# Patient Record
Sex: Female | Born: 1975 | Race: Asian | Hispanic: Yes | Smoking: Never smoker
Health system: Southern US, Community
[De-identification: ages and names within clinical notes are randomized; demographics above are authoritative.]

## PROBLEM LIST (undated history)

## (undated) DIAGNOSIS — F329 Major depressive disorder, single episode, unspecified: Secondary | ICD-10-CM

## (undated) DIAGNOSIS — F32A Depression, unspecified: Secondary | ICD-10-CM

## (undated) DIAGNOSIS — F419 Anxiety disorder, unspecified: Secondary | ICD-10-CM

## (undated) DIAGNOSIS — E282 Polycystic ovarian syndrome: Secondary | ICD-10-CM

## (undated) DIAGNOSIS — N939 Abnormal uterine and vaginal bleeding, unspecified: Secondary | ICD-10-CM

## (undated) HISTORY — DX: Polycystic ovarian syndrome: E28.2

## (undated) HISTORY — DX: Abnormal uterine and vaginal bleeding, unspecified: N93.9

## (undated) HISTORY — DX: Anxiety disorder, unspecified: F41.9

## (undated) HISTORY — DX: Depression, unspecified: F32.A

## (undated) HISTORY — DX: Major depressive disorder, single episode, unspecified: F32.9

---

## 2015-03-29 ENCOUNTER — Emergency Department (HOSPITAL_COMMUNITY)
Admission: EM | Admit: 2015-03-29 | Discharge: 2015-03-29 | Disposition: A | Discharge status: Home / Self Care | Payer: BLUE CROSS/BLUE SHIELD | Source: Home / Self Care | Attending: Emergency Medicine | Admitting: Emergency Medicine

## 2015-03-29 ENCOUNTER — Encounter (HOSPITAL_COMMUNITY): Payer: Self-pay | Admitting: Emergency Medicine

## 2015-03-29 ENCOUNTER — Other Ambulatory Visit (HOSPITAL_COMMUNITY)
Admission: RE | Admit: 2015-03-29 | Discharge: 2015-03-29 | Disposition: A | Discharge status: Home / Self Care | Payer: BLUE CROSS/BLUE SHIELD | Source: Ambulatory Visit | Attending: Emergency Medicine | Admitting: Emergency Medicine

## 2015-03-29 DIAGNOSIS — N939 Abnormal uterine and vaginal bleeding, unspecified: Secondary | ICD-10-CM

## 2015-03-29 DIAGNOSIS — D179 Benign lipomatous neoplasm, unspecified: Secondary | ICD-10-CM

## 2015-03-29 DIAGNOSIS — Z113 Encounter for screening for infections with a predominantly sexual mode of transmission: Secondary | ICD-10-CM | POA: Insufficient documentation

## 2015-03-29 DIAGNOSIS — N760 Acute vaginitis: Secondary | ICD-10-CM | POA: Insufficient documentation

## 2015-03-29 LAB — POCT I-STAT, CHEM 8
BUN: 6 mg/dL (ref 6–20)
CHLORIDE: 105 mmol/L (ref 101–111)
Calcium, Ion: 1.24 mmol/L — ABNORMAL HIGH (ref 1.12–1.23)
Creatinine, Ser: 0.8 mg/dL (ref 0.44–1.00)
Glucose, Bld: 103 mg/dL — ABNORMAL HIGH (ref 65–99)
HEMATOCRIT: 30 % — AB (ref 36.0–46.0)
Hemoglobin: 10.2 g/dL — ABNORMAL LOW (ref 12.0–15.0)
POTASSIUM: 3.9 mmol/L (ref 3.5–5.1)
Sodium: 141 mmol/L (ref 135–145)
TCO2: 23 mmol/L (ref 0–100)

## 2015-03-29 LAB — POCT URINALYSIS DIP (DEVICE)
Bilirubin Urine: NEGATIVE
Glucose, UA: NEGATIVE mg/dL
Ketones, ur: NEGATIVE mg/dL
LEUKOCYTES UA: NEGATIVE
NITRITE: NEGATIVE
PH: 7.5 (ref 5.0–8.0)
PROTEIN: NEGATIVE mg/dL
SPECIFIC GRAVITY, URINE: 1.015 (ref 1.005–1.030)
UROBILINOGEN UA: 0.2 mg/dL (ref 0.0–1.0)

## 2015-03-29 LAB — POCT PREGNANCY, URINE: Preg Test, Ur: NEGATIVE

## 2015-03-29 MED ORDER — FERROUS SULFATE 325 (65 FE) MG PO TABS: 325.0000 mg | ORAL_TABLET | Freq: Every day | ORAL | Status: DC

## 2015-03-29 MED ORDER — MEDROXYPROGESTERONE ACETATE 10 MG PO TABS: 10.0000 mg | ORAL_TABLET | Freq: Every day | ORAL | Status: DC

## 2015-03-29 NOTE — Discharge Instructions (Signed)
Please take Provera daily for the next 2 weeks. This should stop your bleeding. You should expect to have a more normal period after you've finished the Provera. Please take an iron pill daily as you are a little anemic.  If the bleeding does not stop or gets worse, please go to [facility]. If you are getting more and more dizzy or the palpitations are getting worse, please go to [facility].  Please call the women's outpatient clinics to set up an appointment for additional evaluation and management. If you are unable to get an appointment there, they also have satellite offices in [place] and [place].

## 2015-03-29 NOTE — ED Notes (Signed)
C/o menstrual problems States for 10 days she has had a heavy cycle States she had these same sx a year ago

## 2015-03-29 NOTE — ED Provider Notes (Signed)
CSN: [id number]     Arrival date & time 03/29/15  1306 History   First MD Initiated Contact with Patient 03/29/15 1332     Chief Complaint  Patient presents with  . Menstrual Problem   (Consider location/radiation/quality/duration/timing/severity/associated sxs/prior Treatment) HPI  She is a 39 year old woman here for evaluation of abnormal bleeding. She states she has a history of irregular periods. She states her period started 10 days ago and has been much heavier than usual. She also reports clots. She states she has to change her pad every hour.  She denies any pain. She is not on any form of birth control. She is not currently sexually active. She denies any vaginal discharge, but does report an acidic odor last week. She states she will get dizzy if she walks too far. She also reports some intermittent shortness of breath and palpitations.  She also mentions a bump on her left lower back that has been present for 2-3 years.  History reviewed. No pertinent past medical history. No past surgical history on file. History reviewed. No pertinent family history. Social History  Substance Use Topics  . Smoking status: None  . Smokeless tobacco: None  . Alcohol Use: None   OB History    No data available     Review of Systems As in history of present illness Allergies  Review of patient's allergies indicates no known allergies.  Home Medications   Prior to Admission medications   Medication Sig Start Date End Date Taking? Authorizing Provider  ferrous sulfate 325 (65 FE) MG tablet Take 1 tablet (325 mg total) by mouth daily with breakfast. 03/29/15   Melony Overly, MD  medroxyPROGESTERone (PROVERA) 10 MG tablet Take 1 tablet (10 mg total) by mouth daily. 03/29/15   Melony Overly, MD   Meds Ordered and Administered this Visit  Medications - No data to display  BP 126/82 mmHg  Pulse 85  Temp(Src) 98.3 F (36.8 C)  Resp 12  SpO2 100%  LMP 03/20/2015 No data  found.   Physical Exam  Constitutional: She is oriented to person, place, and time. She appears well-developed and well-nourished. No distress.  Cardiovascular: Normal rate.   Pulmonary/Chest: Effort normal.  Genitourinary: There is no rash on the right labia. There is no rash on the left labia. Uterus is not enlarged and not tender. Cervix exhibits no motion tenderness and no discharge. Right adnexum displays no mass and no tenderness. Left adnexum displays no mass and no tenderness. There is bleeding in the vagina. No tenderness in the vagina. No signs of injury around the vagina. No vaginal discharge found.  Neurological: She is alert and oriented to person, place, and time.  Skin: Skin is warm and dry. No pallor.  She has a 1 cm soft, mobile, nontender nodule in her left lower back consistent with a lipoma.    ED Course  Procedures (including critical care time)  Labs Review Labs Reviewed  POCT URINALYSIS DIP (DEVICE) - Abnormal; Notable for the following:    Hgb urine dipstick MODERATE (*)    All other components within normal limits  POCT I-STAT, CHEM 8 - Abnormal; Notable for the following:    Glucose, Bld 103 (*)    Calcium, Ion 1.24 (*)    Hemoglobin 10.2 (*)    HCT 30.0 (*)    All other components within normal limits  POCT PREGNANCY, URINE    Imaging Review No results found.     MDM  1. Abnormal uterine bleeding   2. Lipoma    Provera for bleeding. Prescription given for iron pills for anemia. Return precautions reviewed. Reassurance provided regarding lipoma. Recommended follow-up with an OB/GYN doctor for her irregular periods.    Melony Overly, MD 03/29/15 1500

## 2015-03-30 LAB — CERVICOVAGINAL ANCILLARY ONLY
CHLAMYDIA, DNA PROBE: NEGATIVE
Neisseria Gonorrhea: NEGATIVE
Trichomonas: NEGATIVE

## 2015-03-30 NOTE — ED Notes (Signed)
Final report of STD testing negative

## 2015-03-31 LAB — CERVICOVAGINAL ANCILLARY ONLY: Wet Prep (BD Affirm): NEGATIVE

## 2015-04-07 NOTE — ED Notes (Signed)
Final report of wet prep testing negative

## 2015-04-13 ENCOUNTER — Encounter (HOSPITAL_COMMUNITY): Payer: Self-pay | Admitting: Emergency Medicine

## 2015-04-13 ENCOUNTER — Emergency Department (INDEPENDENT_AMBULATORY_CARE_PROVIDER_SITE_OTHER)
Admission: EM | Admit: 2015-04-13 | Discharge: 2015-04-13 | Disposition: A | Discharge status: Home / Self Care | Payer: BLUE CROSS/BLUE SHIELD | Source: Home / Self Care | Attending: Family Medicine | Admitting: Family Medicine

## 2015-04-13 DIAGNOSIS — N939 Abnormal uterine and vaginal bleeding, unspecified: Secondary | ICD-10-CM

## 2015-04-13 MED ORDER — NORETHINDRONE ACET-ETHINYL EST 1-20 MG-MCG PO TABS: ORAL_TABLET | ORAL | Status: AC

## 2015-04-13 NOTE — ED Provider Notes (Signed)
CSN: [id number]     Arrival date & time 04/13/15  1824 History   First MD Initiated Contact with Patient 04/13/15 1942     Chief Complaint  Patient presents with  . Vaginal Bleeding   (Consider location/radiation/quality/duration/timing/severity/associated sxs/prior Treatment) HPI Comments: 39 year old female with persistent vaginal bleeding. This started approximately 3-4 weeks ago. She was seen at this urgent care on November 16 and treated with Provera. The bleeding slowed down and even stopped for short time before restarting approximately 5-6 days ago. She states she has a slow but steady vaginal bleeding. Denies any associated discharge. She does have pelvic cramping. Her recent pregnancy test here was negative. Vaginal exam performed 2 weeks ago was unremarkable other than bleeding.   History reviewed. No pertinent past medical history. No past surgical history on file. No family history on file. Social History  Substance Use Topics  . Smoking status: None  . Smokeless tobacco: None  . Alcohol Use: None   OB History    No data available     Review of Systems  Constitutional: Negative.   HENT: Negative.   Respiratory: Negative.   Cardiovascular: Negative.   Genitourinary: Positive for vaginal bleeding, menstrual problem and pelvic pain. Negative for vaginal discharge.  Neurological: Negative.     Allergies  Review of patient's allergies indicates no known allergies.  Home Medications   Prior to Admission medications   Medication Sig Start Date End Date Taking? Authorizing Provider  ferrous sulfate 325 (65 FE) MG tablet Take 1 tablet (325 mg total) by mouth daily with breakfast. 03/29/15   Melony Overly, MD  medroxyPROGESTERone (PROVERA) 10 MG tablet Take 1 tablet (10 mg total) by mouth daily. 03/29/15   Melony Overly, MD  norethindrone-ethinyl estradiol (MICROGESTIN,JUNEL,LOESTRIN) 1-20 MG-MCG tablet Take 1 tab tid for 3 days, then 1 bid for 3 days, then 1 q d. 04/13/15    Janne Napoleon, NP   Meds Ordered and Administered this Visit  Medications - No data to display  BP 121/77 mmHg  Pulse 87  Temp(Src) 98.9 F (37.2 C) (Oral)  Resp 16  SpO2 100%  LMP 04/13/2015 No data found.   Physical Exam  Constitutional: She is oriented to person, place, and time. She appears well-developed and well-nourished. No distress.  Eyes: EOM are normal.  Neck: Normal range of motion. Neck supple.  Cardiovascular: Normal rate.   Pulmonary/Chest: Effort normal. No respiratory distress.  Abdominal: Soft. There is no tenderness.  Musculoskeletal: She exhibits no edema.  Neurological: She is alert and oriented to person, place, and time. She exhibits normal muscle tone.  Skin: Skin is warm and dry.  Psychiatric: She has a normal mood and affect.  Nursing note and vitals reviewed.   ED Course  Procedures (including critical care time)  Labs Review Labs Reviewed - No data to display  Imaging Review No results found.   Visual Acuity Review  Right Eye Distance:   Left Eye Distance:   Bilateral Distance:    Right Eye Near:   Left Eye Near:    Bilateral Near:         MDM   1. Abnormal uterine bleeding (AUB)    loestrin as directed. If not improving in 2 days or to the [facility]. For any worsening or increased bleeding, weakness or other problems go directly to the [facility] emergency department    Janne Napoleon, NP 04/13/15 2015

## 2015-04-13 NOTE — ED Notes (Signed)
Patient complains of vaginal bleeding for one week.  Had been bleeding for 2 weeks, seen at [facility] 11/16.  Bleeding stopped for one week and now has been bleeding for a week.  Says ob/gyn could not see her today.  Back pain and abdominal discomfort like with usual menstruation

## 2015-04-13 NOTE — Discharge Instructions (Signed)
Abnormal Uterine Bleeding Abnormal uterine bleeding can affect women at various stages in life, including teenagers, women in their reproductive years, pregnant women, and women who have reached menopause. Several kinds of uterine bleeding are considered abnormal, including:  Bleeding or spotting between periods.   Bleeding after sexual intercourse.   Bleeding that is heavier or more than normal.   Periods that last longer than usual.  Bleeding after menopause.  Many cases of abnormal uterine bleeding are minor and simple to treat, while others are more serious. Any type of abnormal bleeding should be evaluated by your health care provider. Treatment will depend on the cause of the bleeding. HOME CARE INSTRUCTIONS Monitor your condition for any changes. The following actions may help to alleviate any discomfort you are experiencing:  Avoid the use of tampons and douches as directed by your health care provider.  Change your pads frequently. You should get regular pelvic exams and Pap tests. Keep all follow-up appointments for diagnostic tests as directed by your health care provider.  SEEK MEDICAL CARE IF:   Your bleeding lasts more than 1 week.   You feel dizzy at times.  SEEK IMMEDIATE MEDICAL CARE IF:   You pass out.   You are changing pads every 15 to 30 minutes.   You have abdominal pain.  You have a fever.   You become sweaty or weak.   You are passing large blood clots from the vagina.   You start to feel nauseous and vomit. MAKE SURE YOU:   Understand these instructions.  Will watch your condition.  Will get help right away if you are not doing well or get worse.   This information is not intended to replace advice given to you by your health care provider. Make sure you discuss any questions you have with your health care provider.   Document Released: 04/29/2005 Document Revised: 05/04/2013 Document Reviewed: 11/26/2012 Elsevier Interactive  Patient Education 2016 Elsevier Inc.  Dysfunctional Uterine Bleeding Dysfunctional uterine bleeding is abnormal bleeding from the uterus. Dysfunctional uterine bleeding includes:  A period that comes earlier or later than usual.  A period that is lighter, heavier, or has blood clots.  Bleeding between periods.  Skipping one or more periods.  Bleeding after sexual intercourse.  Bleeding after menopause. HOME CARE INSTRUCTIONS  Pay attention to any changes in your symptoms. Follow these instructions to help with your condition: Eating  Eat well-balanced meals. Include foods that are high in iron, such as liver, meat, shellfish, green leafy vegetables, and eggs.  If you become constipated:  Drink plenty of water.  Eat fruits and vegetables that are high in water and fiber, such as spinach, carrots, raspberries, apples, and mango. Medicines  Take over-the-counter and prescription medicines only as told by your health care provider.  Do not change medicines without talking with your health care provider.  Aspirin or medicines that contain aspirin may make the bleeding worse. Do not take those medicines:  During the week before your period.  During your period.  If you were prescribed iron pills, take them as told by your health care provider. Iron pills help to replace iron that your body loses because of this condition. Activity  If you need to change your sanitary pad or tampon more than one time every 2 hours:  Lie in bed with your feet raised (elevated).  Place a cold pack on your lower abdomen.  Rest as much as possible until the bleeding stops or slows down.  Do not try to lose weight until the bleeding has stopped and your blood iron level is back to normal. Other Instructions  For two months, write down:  When your period starts.  When your period ends.  When any abnormal bleeding occurs.  What problems you notice.  Keep all follow up visits as told  by your health care provider. This is important. SEEK MEDICAL CARE IF:  You get light-headed or weak.  You have nausea and vomiting.  You cannot eat or drink without vomiting.  You feel dizzy or have diarrhea while you are taking medicines.  You are taking birth control pills or hormones, and you want to change them or stop taking them. SEEK IMMEDIATE MEDICAL CARE IF:  You develop a fever or chills.  You need to change your sanitary pad or tampon more than one time per hour.  Your bleeding becomes heavier, or your flow contains clots more often.  You develop pain in your abdomen.  You lose consciousness.  You develop a rash.   This information is not intended to replace advice given to you by your health care provider. Make sure you discuss any questions you have with your health care provider.   Document Released: 04/26/2000 Document Revised: 01/18/2015 Document Reviewed: 07/25/2014 Elsevier Interactive Patient Education [employer].

## 2015-04-17 ENCOUNTER — Encounter: Payer: Self-pay | Admitting: Obstetrics & Gynecology

## 2015-04-17 ENCOUNTER — Ambulatory Visit (INDEPENDENT_AMBULATORY_CARE_PROVIDER_SITE_OTHER): Payer: BLUE CROSS/BLUE SHIELD | Admitting: Obstetrics & Gynecology

## 2015-04-17 VITALS — BP 146/83 | HR 92 | Ht 65.0 in | Wt 184.0 lb

## 2015-04-17 DIAGNOSIS — N939 Abnormal uterine and vaginal bleeding, unspecified: Secondary | ICD-10-CM | POA: Diagnosis not present

## 2015-04-17 DIAGNOSIS — Z124 Encounter for screening for malignant neoplasm of cervix: Secondary | ICD-10-CM

## 2015-04-17 DIAGNOSIS — Z1151 Encounter for screening for human papillomavirus (HPV): Secondary | ICD-10-CM | POA: Diagnosis not present

## 2015-04-17 DIAGNOSIS — Z0000 Encounter for general adult medical examination without abnormal findings: Secondary | ICD-10-CM

## 2015-04-17 LAB — TSH: TSH: 2.977 u[IU]/mL (ref 0.350–4.500)

## 2015-04-17 LAB — CBC
HEMATOCRIT: 27.1 % — AB (ref 36.0–46.0)
Hemoglobin: 8.7 g/dL — ABNORMAL LOW (ref 12.0–15.0)
MCH: 27.8 pg (ref 26.0–34.0)
MCHC: 32.1 g/dL (ref 30.0–36.0)
MCV: 86.6 fL (ref 78.0–100.0)
MPV: 8.7 fL (ref 8.6–12.4)
Platelets: 342 10*3/uL (ref 150–400)
RBC: 3.13 MIL/uL — AB (ref 3.87–5.11)
RDW: 16.1 % — AB (ref 11.5–15.5)
WBC: 6.7 10*3/uL (ref 4.0–10.5)

## 2015-04-17 LAB — HM MAMMOGRAPHY

## 2015-04-17 MED ORDER — MEGESTROL ACETATE 40 MG PO TABS: 40.0000 mg | ORAL_TABLET | Freq: Two times a day (BID) | ORAL | Status: DC

## 2015-04-17 NOTE — Patient Instructions (Signed)
[facility]   [phone]   [address] [place], [place] [address]  [facility] [phone]  [address] [place], [place] [address]  Dysfunctional Uterine Bleeding Dysfunctional uterine bleeding is abnormal bleeding from the uterus. Dysfunctional uterine bleeding includes:  A period that comes earlier or later than usual.  A period that is lighter, heavier, or has blood clots.  Bleeding between periods.  Skipping one or more periods.  Bleeding after sexual intercourse.  Bleeding after menopause. HOME CARE INSTRUCTIONS  Pay attention to any changes in your symptoms. Follow these instructions to help with your condition: Eating  Eat well-balanced meals. Include foods that are high in iron, such as liver, meat, shellfish, green leafy vegetables, and eggs.  If you become constipated:  Drink plenty of water.  Eat fruits and vegetables that are high in water and fiber, such as spinach, carrots, raspberries, apples, and mango. Medicines  Take over-the-counter and prescription medicines only as told by your health care provider.  Do not change medicines without talking with your health care provider.  Aspirin or medicines that contain aspirin may make the bleeding worse. Do not take those medicines:  During the week before your period.  During your period.  If you were prescribed iron pills, take them as told by your health care provider. Iron pills help to replace iron that your body loses because of this condition. Activity  If you need to change your sanitary pad or tampon more than one time every 2 hours:  Lie in bed with your feet raised (elevated).  Place a cold pack on your lower abdomen.  Rest as much as possible until the bleeding stops or slows down.  Do not try to lose weight until the bleeding has stopped and your blood iron level is back to normal. Other Instructions  For two months, write down:  When your period  starts.  When your period ends.  When any abnormal bleeding occurs.  What problems you notice.  Keep all follow up visits as told by your health care provider. This is important. SEEK MEDICAL CARE IF:  You get light-headed or weak.  You have nausea and vomiting.  You cannot eat or drink without vomiting.  You feel dizzy or have diarrhea while you are taking medicines.  You are taking birth control pills or hormones, and you want to change them or stop taking them. SEEK IMMEDIATE MEDICAL CARE IF:  You develop a fever or chills.  You need to change your sanitary pad or tampon more than one time per hour.  Your bleeding becomes heavier, or your flow contains clots more often.  You develop pain in your abdomen.  You lose consciousness.  You develop a rash.   This information is not intended to replace advice given to you by your health care provider. Make sure you discuss any questions you have with your health care provider.   Document Released: 04/26/2000 Document Revised: 01/18/2015 Document Reviewed: 07/25/2014 Elsevier Interactive Patient Education [employer].

## 2015-04-17 NOTE — Progress Notes (Signed)
CLINIC ENCOUNTER NOTE  History:  39 y.o. G0 here today for evaluation and management of AUB. She first presented to [facility] [place] on 03/29/2015.  As per Dr. Veverly Fells notes: "She states she has a history of irregular periods. She states her period started 10 days ago and has been much heavier than usual. She also reports clots. She states she has to change her pad every hour. She denies any pain. She is not on any form of birth control. She is not currently sexually active. She denies any vaginal discharge, but does report an acidic odor last week. She states she will get dizzy if she walks too far. She also reports some intermittent shortness of breath and palpitations."  Hemoglobin was 10.2.  UPT and pelvic cultures were were negative. She was treated with Provera for 14 days and prescribed oral iron therapy, and told to follow up with GYN provider.    However, she re-presented on 04/13/2015 and as per Janne Napoleon, NP's notes: "39 year old female with persistent vaginal bleeding. This started approximately 3-4 weeks ago. She was seen at this urgent care on November 16 and treated with Provera. The bleeding slowed down and even stopped for short time before restarting approximately 5-6 days ago. She states she has a slow but steady vaginal bleeding. Denies any associated discharge. She does have pelvic cramping. Her recent pregnancy test here was negative. Vaginal exam performed 2 weeks ago was unremarkable other than bleeding."  She was prescribed Lo-estrin 1/20 and advised to follow up with GYN provider. Today, patient reports not taking the Lo-estrin as prescribed.  Still having moderate bleeding and feels tired.  She denies any abnormal vaginal discharge, pelvic pain or other concerns.   Patient does desire future pregnancy, and wants referral/information for fertility specialist as she is interested in freezing her eggs.  History reviewed. No pertinent past medical  history.  History reviewed. No pertinent past surgical history.  The following portions of the patient's history were reviewed and updated as appropriate: allergies, current medications, past family history, past medical history, past social history, past surgical history and problem list.   Health Maintenance:  Not had pap since over 10 years ago, denies abnormal pap smears.  Review of Systems:  Pertinent items noted in HPI and remainder of comprehensive ROS otherwise negative.  Objective:  Physical Exam BP 146/83 mmHg  Pulse 92  Ht 5\' 5"  (1.651 m)  Wt 184 lb (83.462 kg)  BMI 30.62 kg/m2  LMP 04/13/2015 CONSTITUTIONAL: Well-developed, well-nourished female in no acute distress.  HENT:  Normocephalic, atraumatic. External right and left ear normal. Oropharynx is clear and moist EYES: Conjunctivae and EOM are normal. Pupils are equal, round, and reactive to light. No scleral icterus.  NECK: Normal range of motion, supple, no masses SKIN: Skin is warm and dry. No rash noted. Not diaphoretic. No erythema. No pallor. Downs: Alert and oriented to person, place, and time. Normal reflexes, muscle tone coordination. No cranial nerve deficit noted. PSYCHIATRIC: Normal mood and affect. Normal behavior. Normal judgment and thought content. CARDIOVASCULAR: Normal heart rate noted RESPIRATORY: Effort and breath sounds normal, no problems with respiration noted ABDOMEN: Soft, no distention noted.   PELVIC: Normal appearing external genitalia; normal appearing vaginal mucosa and cervix without lesions.  Small amount of blood in vault, slow trickle of blood from cervix. No abnormal vaginal discharge noted.  Normal uterine size, no other palpable masses, no uterine or adnexal tenderness. MUSCULOSKELETAL: Normal range of motion. No  edema noted.  Labs and Imaging 03/29/2015 UPT neg, H/H 10.2/30.0  GC, Chlam, Trich all negative  Assessment & Plan:  1. Abnormal uterine bleeding (AUB) Patient has  anovulatory abnormal uterine bleeding. She has a normal exam, no evidence of lesions.  Will order abnormal uterine bleeding evaluation labs and pelvic ultrasound to evaluate for any structural gynecologic abnormalities.  Will contact patient with these results and plans for further evaluation/management. Discussed management options for abnormal uterine bleeding in the setting of desire for future pregnancy including tranexamic acid (Lysteda), oral progesterone (Megace), Depo Provera, Mirena IUD,Discussed risks and benefits of each method.   Patient desires Megace for now. Megace prescribed as needed for now,  bleeding precautions reviewed.  Printed patient education handouts were given to the patient to review at home. - Follicle stimulating hormone - hCG, quantitative, pregnancy - TSH - Testosterone, Free, Total, SHBG - Prolactin - US Transvaginal Non-OB; Future - US Pelvis Complete; Future - Cytology - PAP with HPV, also for cervical cancer screening - CBC - megestrol (MEGACE) 40 MG tablet; Take 1 tablet (40 mg total) by mouth 2 (two) times daily. Can increase to two tablets twice a day in the event of heavy bleeding  Dispense: 60 tablet; Refill: 5  2. Preventative health care Referral placed to [facility] - Ambulatory referral to Family Practice  3. Egg-freezing interest Information given for [facility] here in [facility] in [place]  Routine preventative health maintenance measures emphasized. Please refer to After Visit Summary for other counseling recommendations.   Return in about 2 weeks (around 05/01/2015) for Followup.   Total face-to-face time with patient: 30 minutes. Over 50% of encounter was spent on counseling and coordination of care.   Verita Schneiders, MD, [place] Attending Obstetrician & Gynecologist, [place] for [facility]

## 2015-04-18 ENCOUNTER — Encounter: Payer: Self-pay | Admitting: Behavioral Health

## 2015-04-18 ENCOUNTER — Telehealth: Payer: Self-pay | Admitting: Behavioral Health

## 2015-04-18 ENCOUNTER — Telehealth: Payer: Self-pay

## 2015-04-18 LAB — TESTOSTERONE, FREE, TOTAL, SHBG
Sex Hormone Binding: 26 nmol/L (ref 17–124)
TESTOSTERONE FREE: 8.6 pg/mL — AB (ref 0.6–6.8)
TESTOSTERONE: 42 ng/dL (ref 10–70)
Testosterone-% Free: 2.1 % (ref 0.4–2.4)

## 2015-04-18 LAB — PROLACTIN: Prolactin: 11.2 ng/mL

## 2015-04-18 LAB — FOLLICLE STIMULATING HORMONE: FSH: 6.9 m[IU]/mL

## 2015-04-18 LAB — HCG, QUANTITATIVE, PREGNANCY

## 2015-04-18 MED ORDER — FERROUS SULFATE 325 (65 FE) MG PO TABS
325.0000 mg | ORAL_TABLET | Freq: Two times a day (BID) | ORAL | Status: AC
Start: 2015-04-18 — End: ?

## 2015-04-18 NOTE — Telephone Encounter (Signed)
Left message for patient making her aware of abnormal hgb and a prescription has been sent in for iron pills she should take twice daily. Patient encouraged to call back for further details. Kathrene Alu RN BSN

## 2015-04-18 NOTE — Telephone Encounter (Signed)
Pre-Visit Call completed with patient and chart updated.   Pre-Visit Info documented in Specialty Comments under [employer].    

## 2015-04-18 NOTE — Addendum Note (Signed)
Addended by: Verita Schneiders A on: 04/18/2015 01:08 PM   Modules accepted: Orders

## 2015-04-18 NOTE — Telephone Encounter (Signed)
-----   Message from Osborne Oman, MD sent at 04/18/2015  1:09 PM EST ----- Patient has anemia, please encourage to take iron pills twice a day. Prescription sent to pharmacy on file.

## 2015-04-19 ENCOUNTER — Ambulatory Visit (INDEPENDENT_AMBULATORY_CARE_PROVIDER_SITE_OTHER): Payer: BLUE CROSS/BLUE SHIELD | Admitting: Family

## 2015-04-19 ENCOUNTER — Encounter: Payer: Self-pay | Admitting: Family

## 2015-04-19 VITALS — BP 111/64 | HR 82 | Temp 98.8°F | Resp 16 | Ht 65.5 in | Wt 176.8 lb

## 2015-04-19 DIAGNOSIS — Z114 Encounter for screening for human immunodeficiency virus [HIV]: Secondary | ICD-10-CM | POA: Diagnosis not present

## 2015-04-19 DIAGNOSIS — D509 Iron deficiency anemia, unspecified: Secondary | ICD-10-CM | POA: Diagnosis not present

## 2015-04-19 DIAGNOSIS — F418 Other specified anxiety disorders: Secondary | ICD-10-CM

## 2015-04-19 LAB — CYTOLOGY - PAP

## 2015-04-19 MED ORDER — VENLAFAXINE HCL ER 37.5 MG PO CP24: ORAL_CAPSULE | ORAL | Status: AC

## 2015-04-19 NOTE — Patient Instructions (Signed)
Please complete lab work prior to leaving. Start Effexor one tab once daily for 3 days, then increase to 2 tabs once daily. Please contact [place] to arrange appointment with counselor. [phone] Call [phone] if you have recurrent thoughts of hurting yourself.

## 2015-04-19 NOTE — Progress Notes (Signed)
Pre visit review using our clinic review tool, if applicable. No additional management support is needed unless otherwise documented below in the visit note. 

## 2015-04-19 NOTE — Assessment & Plan Note (Addendum)
Advised pt to increase iron to twice daily.  AUB work up ongoing per gyn.

## 2015-04-19 NOTE — Progress Notes (Addendum)
Subjective:    Patient ID: Shawna Gutierrez, female    DOB: 24-Jun-1975, 39 y.o.   MRN: [id number]  HPI  Ms. Shawna Gutierrez is a 72 old female who presents today to establish care.  1) Anxiety/Depression- reports that she never feels satisfied with herself.  She has tried meds in the past. Reports that she has tried zoloft.  Had trouble sleeping, felt "out of touch with reality."   She does not recall the meds.  Reports that she had some panic attacks during her divorce.  She reports that she did have thought of hurting herself last night "for a few seconds."  She briefly had thought that she could hang herself.  She denies current thought of hurting herself today.    2) Insomnia- Reports trouble falling asleep.  Reports eating too much.    3) Anemia- maintained on iron.  Has hx of abnormal uterine bleeding. She is followed by GYN (Dr. Harolyn Rutherford).   Lab Results  Component Value Date   WBC 6.7 04/17/2015   HGB 8.7* 04/17/2015   HCT 27.1* 04/17/2015   MCV 86.6 04/17/2015   PLT 342 04/17/2015   She had been taking iron once daily and gyn recommended to her yesterday that she increase to twice daily.  Review of Systems   see HPI  Past Medical History  Diagnosis Date  . Depression   . Abnormal uterine bleeding   . Anxiety     Social History   Social History  . Marital Status: Divorced    Spouse Name: N/A  . Number of Children: N/A  . Years of Education: N/A   Occupational History  . Not on file.   Social History Main Topics  . Smoking status: Never Smoker   . Smokeless tobacco: Not on file  . Alcohol Use: No  . Drug Use: No  . Sexual Activity: No   Other Topics Concern  . Not on file   Social History Narrative   Family is in [place]   No children   Divorced august, separated last year       History reviewed. No pertinent past surgical history.  Family History  Problem Relation Age of Onset  . Hypertension Mother   . Hypertension Father   . Diabetes Maternal Grandmother     . Cancer Maternal Grandfather     No Known Allergies  Current Outpatient Prescriptions on File Prior to Visit  Medication Sig Dispense Refill  . ferrous sulfate 325 (65 FE) MG tablet Take 1 tablet (325 mg total) by mouth 2 (two) times daily with a meal. 60 tablet 3  . norethindrone-ethinyl estradiol (MICROGESTIN,JUNEL,LOESTRIN) 1-20 MG-MCG tablet Take 1 tab tid for 3 days, then 1 bid for 3 days, then 1 q d. (Patient not taking: Reported on 04/17/2015) 1 Package 0   No current facility-administered medications on file prior to visit.    BP 111/64 mmHg  Pulse 82  Temp(Src) 98.8 F (37.1 C) (Oral)  Resp 16  Ht 5' 5.5" (1.664 m)  Wt 176 lb 12.8 oz (80.196 kg)  BMI 28.96 kg/m2  SpO2 99%  LMP 04/13/2015    Objective:   Physical Exam  Constitutional: She is oriented to person, place, and time. She appears well-developed and well-nourished.  HENT:  Head: Normocephalic and atraumatic.  Pulmonary/Chest: Effort normal and breath sounds normal.  Musculoskeletal: She exhibits no edema.  Neurological: She is alert and oriented to person, place, and time.  Psychiatric:  Flat affect, calm, pleasant  Assessment & Plan:  Addendum: spoke with Rodney Cruise, Counselor. She contacted patient and will bring her in on Friday for counseling visit.

## 2015-04-19 NOTE — Assessment & Plan Note (Signed)
Uncontrolled.  Trial of effexor.  Patient contracts for safety today- agrees to call 911 if she has recurrent thoughts of suicide.  I will try to get her in soon with one of our therapists.  30 minutes spent with pt today.  50% of this time was spent counseling pt on anxiety, depression, counseling and treatment.

## 2015-04-20 ENCOUNTER — Encounter: Payer: Self-pay | Admitting: Obstetrics & Gynecology

## 2015-04-20 ENCOUNTER — Ambulatory Visit (HOSPITAL_BASED_OUTPATIENT_CLINIC_OR_DEPARTMENT_OTHER)
Admission: RE | Admit: 2015-04-20 | Discharge: 2015-04-20 | Disposition: A | Discharge status: Home / Self Care | Payer: BLUE CROSS/BLUE SHIELD | Source: Ambulatory Visit | Attending: Obstetrics & Gynecology | Admitting: Obstetrics & Gynecology

## 2015-04-20 DIAGNOSIS — N939 Abnormal uterine and vaginal bleeding, unspecified: Secondary | ICD-10-CM

## 2015-04-20 DIAGNOSIS — E282 Polycystic ovarian syndrome: Secondary | ICD-10-CM | POA: Insufficient documentation

## 2015-04-20 HISTORY — DX: Polycystic ovarian syndrome: E28.2

## 2015-04-21 ENCOUNTER — Telehealth: Payer: Self-pay

## 2015-04-21 ENCOUNTER — Ambulatory Visit (INDEPENDENT_AMBULATORY_CARE_PROVIDER_SITE_OTHER): Payer: BLUE CROSS/BLUE SHIELD | Admitting: Psychology

## 2015-04-21 DIAGNOSIS — F331 Major depressive disorder, recurrent, moderate: Secondary | ICD-10-CM

## 2015-04-21 NOTE — Telephone Encounter (Signed)
Patient called and made aware of results of ultrasound, labwork and incomplete pap smear. Patient made aware that she could have PCOS and it would explain some of the abnormal bleeding between periods. Patient made aware that Dr. Loni Muse would like her to have a follow up appointment to discuss diagnosis and management- patient is scheduled to return on Dec. 19th and states she just finished her period and should not have any bleeding at that time so we can recollect pap smear. Patient to call back to office if she does begin bleeding to reschedule. Patient states understanding. Kathrene Alu RN BSN

## 2015-04-21 NOTE — Telephone Encounter (Signed)
Elevated free testosterone is indicative of PCOS. Please make appointment for patient to discuss diagnosis and management. Please call to inform patient of results and recommendations.

## 2015-04-21 NOTE — Telephone Encounter (Signed)
Pap was unsatisfactory secondary to active bleeding at time of pap; no cervical cells seen. Need to repeat pap smear when there is no bleeding. Please call to inform patient of results and recommendations.

## 2015-04-21 NOTE — Telephone Encounter (Signed)
-----   Message from Osborne Oman, MD sent at 04/20/2015 12:12 PM EST ----- Normal pelvic ultrasound.  Please call to inform patient of results.

## 2015-04-24 ENCOUNTER — Telehealth: Payer: Self-pay | Admitting: Family

## 2015-04-24 NOTE — Telephone Encounter (Signed)
Opened in error

## 2015-05-01 ENCOUNTER — Encounter: Payer: Self-pay | Admitting: Obstetrics & Gynecology

## 2015-05-01 ENCOUNTER — Ambulatory Visit (INDEPENDENT_AMBULATORY_CARE_PROVIDER_SITE_OTHER): Payer: BLUE CROSS/BLUE SHIELD | Admitting: Obstetrics & Gynecology

## 2015-05-01 VITALS — BP 110/54 | HR 84 | Ht 65.0 in | Wt 181.0 lb

## 2015-05-01 DIAGNOSIS — N939 Abnormal uterine and vaginal bleeding, unspecified: Secondary | ICD-10-CM | POA: Diagnosis not present

## 2015-05-01 DIAGNOSIS — E282 Polycystic ovarian syndrome: Secondary | ICD-10-CM | POA: Diagnosis not present

## 2015-05-01 DIAGNOSIS — Z1151 Encounter for screening for human papillomavirus (HPV): Secondary | ICD-10-CM | POA: Diagnosis not present

## 2015-05-01 DIAGNOSIS — R87615 Unsatisfactory cytologic smear of cervix: Secondary | ICD-10-CM

## 2015-05-01 DIAGNOSIS — Z124 Encounter for screening for malignant neoplasm of cervix: Secondary | ICD-10-CM

## 2015-05-01 NOTE — Patient Instructions (Signed)
Polycystic Ovarian Syndrome  Polycystic ovarian syndrome (PCOS) is a common hormonal disorder among women of reproductive age. Most women with PCOS grow many small cysts on their ovaries. PCOS can cause problems with your periods and make it difficult to get pregnant. It can also cause an increased risk of miscarriage with pregnancy. If left untreated, PCOS can lead to serious health problems, such as diabetes and heart disease.  CAUSES  The cause of PCOS is not fully understood, but genetics may be a factor.  SIGNS AND SYMPTOMS   · Infrequent or no menstrual periods.    · Inability to get pregnant (infertility) because of not ovulating.    · Increased growth of hair on the face, chest, stomach, back, thumbs, thighs, or toes.    · Acne, oily skin, or dandruff.    · Pelvic pain.    · Weight gain or obesity, usually carrying extra weight around the waist.    · Type 2 diabetes.     · High cholesterol.    · High blood pressure.    · Female-pattern baldness or thinning hair.    · Patches of thickened and dark brown or black skin on the neck, arms, breasts, or thighs.    · Tiny excess flaps of skin (skin tags) in the armpits or neck area.    · Excessive snoring and having breathing stop at times while asleep (sleep apnea).    · Deepening of the voice.    · Gestational diabetes when pregnant.    DIAGNOSIS   There is no single test to diagnose PCOS.   · Your health care Shawna Gutierrez will:      Take a medical history.      Perform a pelvic exam.      Have ultrasonography done.      Check your female and female hormone levels.      Measure glucose or sugar levels in the blood.      Do other blood tests.    · If you are producing too many female hormones, your health care Shawna Gutierrez will make sure it is from PCOS. At the physical exam, your health care Shawna Gutierrez will want to evaluate the areas of increased hair growth. Try to allow natural hair growth for a few days before the visit.    · During a pelvic exam, the ovaries may be enlarged  or swollen because of the increased number of small cysts. This can be seen more easily by using vaginal ultrasonography or screening to examine the ovaries and lining of the uterus (endometrium) for cysts. The uterine lining may become thicker if you have not been having a regular period.    TREATMENT   Because there is no cure for PCOS, it needs to be managed to prevent problems. Treatments are based on your symptoms. Treatment is also based on whether you want to have a baby or whether you need contraception.   Treatment may include:   · Progesterone hormone to start a menstrual period.    · Birth control pills to make you have regular menstrual periods.    · Medicines to make you ovulate, if you want to get pregnant.    · Medicines to control your insulin.    · Medicine to control your blood pressure.    · Medicine and diet to control your high cholesterol and triglycerides in your blood.  · Medicine to reduce excessive hair growth.   · Surgery, making small holes in the ovary, to decrease the amount of female hormone production. This is done through a long, lighted tube (laparoscope) placed into the pelvis through a tiny incision in the lower abdomen.      HOME CARE INSTRUCTIONS  · Only take over-the-counter or prescription medicine as directed by your health care Shawna Gutierrez.  · Pay attention to the foods you eat and your activity levels. This can help reduce the effects of PCOS.    Keep your weight under control.    Eat foods that are low in carbohydrate and high in fiber.    Exercise regularly.  SEEK MEDICAL CARE IF:  · Your symptoms do not get better with medicine.  · You have new symptoms.     This information is not intended to replace advice given to you by your health care Shawna Gutierrez. Make sure you discuss any questions you have with your health care Shawna Gutierrez.     Document Released: 08/23/2004 Document Revised: 02/17/2013 Document Reviewed: 10/15/2012  Elsevier Interactive Patient Education ©2016 [employer]  Inc[employer]

## 2015-05-01 NOTE — Progress Notes (Signed)
CLINIC ENCOUNTER NOTE  History:  39 y.o. G0 here today for follow up after evaluation of AUB done during previous visit on 04/17/2015.  Since this visit, she had no further bleeding after taking the course of Megace.  She denies any abnormal vaginal discharge, bleeding, pelvic pain or other concerns.   Of note, patient had an unsatisfactory pap smear at that visit due to active bleeding, will need repeat today.  Past Medical History  Diagnosis Date  . Depression   . Abnormal uterine bleeding   . Anxiety   . PCOS (polycystic ovarian syndrome) 04/20/2015    History reviewed. No pertinent past surgical history.  The following portions of the patient's history were reviewed and updated as appropriate: allergies, current medications, past family history, past medical history, past social history, past surgical history and problem list.   Health Maintenance:  Not had pap since over 10 years ago, denies abnormal pap smears.  Review of Systems:  Pertinent items noted in HPI and remainder of comprehensive ROS otherwise negative.  Objective:  Physical Exam BP 110/54 mmHg  Pulse 84  Ht 5\' 5"  (1.651 m)  Wt 181 lb (82.101 kg)  BMI 30.12 kg/m2  LMP  (LMP Unknown) CONSTITUTIONAL: Well-developed, well-nourished female in no acute distress.  HENT:  Normocephalic, atraumatic. External right and left ear normal. Oropharynx is clear and moist EYES: Conjunctivae and EOM are normal. Pupils are equal, round, and reactive to light. No scleral icterus.  NECK: Normal range of motion, supple, no masses SKIN: Skin is warm and dry. No rash noted. Not diaphoretic. No erythema. No pallor. [place]: Alert and oriented to person, place, and time. Normal reflexes, muscle tone coordination. No cranial nerve deficit noted. PSYCHIATRIC: Normal mood and affect. Normal behavior. Normal judgment and thought content. CARDIOVASCULAR: Normal heart rate noted RESPIRATORY: Effort and breath sounds normal, no problems  with respiration noted ABDOMEN: Soft, no distention noted.   PELVIC: Normal appearing external genitalia; normal appearing vaginal mucosa and cervix. Pap smear obtained. No abnormal discharge noted.  Normal uterine size, no other palpable masses, no uterine or adnexal tenderness. MUSCULOSKELETAL: Normal range of motion. No edema noted.  Labs and Imaging Results for orders placed or performed since visit on 04/17/15   hCG, quantitative, pregnancy   Collection Time: 04/17/15 10:59 AM  Result Value Ref Range   hCG, Beta Chain, Quant, S 123456 mIU/mL  Follicle stimulating hormone   Collection Time: 04/17/15 11:07 AM  Result Value Ref Range   FSH 6.9 mIU/mL  TSH   Collection Time: 04/17/15 11:07 AM  Result Value Ref Range   TSH 2.977 0.350 - 4.500 uIU/mL  Testosterone, Free, Total, SHBG   Collection Time: 04/17/15 11:07 AM  Result Value Ref Range   Testosterone 42 10 - 70 ng/dL   Sex Hormone Binding 26 17 - 124 nmol/L   Testosterone, Free 8.6 (H) 0.6 - 6.8 pg/mL   Testosterone-% Free 2.1 0.4 - 2.4 %  Prolactin   Collection Time: 04/17/15 11:07 AM  Result Value Ref Range   Prolactin 11.2 ng/mL  CBC   Collection Time: 04/17/15 11:07 AM  Result Value Ref Range   WBC 6.7 4.0 - 10.5 K/uL   RBC 3.13 (L) 3.87 - 5.11 MIL/uL   Hemoglobin 8.7 (L) 12.0 - 15.0 g/dL   HCT 27.1 (L) 36.0 - 46.0 %   MCV 86.6 78.0 - 100.0 fL   MCH 27.8 26.0 - 34.0 pg   MCHC 32.1 30.0 - 36.0 g/dL  RDW 16.1 (H) 11.5 - 15.5 %   Platelets 342 150 - 400 K/uL   MPV 8.7 8.6 - 12.4 fL    US Transvaginal Non-ob  04/20/2015  CLINICAL DATA:  Heavy vaginal bleeding 3 weeks.  Unknown LMP. EXAM: TRANSABDOMINAL AND TRANSVAGINAL ULTRASOUND OF PELVIS TECHNIQUE: Both transabdominal and transvaginal ultrasound examinations of the pelvis were performed. Transabdominal technique was performed for global imaging of the pelvis including uterus, ovaries, adnexal regions, and pelvic cul-de-sac. It was necessary to proceed with  endovaginal exam following the transabdominal exam to visualize the uterus and endometrium, ovaries. COMPARISON:  None FINDINGS: Uterus Measurements: 7.5 x 4.4 x 5.2 cm. No fibroids or other mass visualized. Endometrium Thickness: 9.0 mm.  Wrist trace fluid within the endometrial canal. Right ovary Measurements: 3.4 x 1.9 x 2.3 cm. Normal appearance/no adnexal mass. Left ovary Measurements: 3.6 x 1.4 x 2.7 cm. Normal appearance/no adnexal mass. Other findings Small amount of free pelvic fluid. IMPRESSION: 1. Normal appearance of the uterus and ovaries. 2. If bleeding remains unresponsive to hormonal or medical therapy, sonohysterogram should be considered for focal lesion work-up. (Ref: Radiological Reasoning: Algorithmic Workup of Abnormal Vaginal Bleeding with Endovaginal Sonography and Sonohysterography. AJR [id number]) Electronically Signed   By: Nolon Nations M.D.   On: 04/20/2015 11:39   US Pelvis Complete  04/20/2015  CLINICAL DATA:  Heavy vaginal bleeding 3 weeks.  Unknown LMP. EXAM: TRANSABDOMINAL AND TRANSVAGINAL ULTRASOUND OF PELVIS TECHNIQUE: Both transabdominal and transvaginal ultrasound examinations of the pelvis were performed. Transabdominal technique was performed for global imaging of the pelvis including uterus, ovaries, adnexal regions, and pelvic cul-de-sac. It was necessary to proceed with endovaginal exam following the transabdominal exam to visualize the uterus and endometrium, ovaries. COMPARISON:  None FINDINGS: Uterus Measurements: 7.5 x 4.4 x 5.2 cm. No fibroids or other mass visualized. Endometrium Thickness: 9.0 mm.  Wrist trace fluid within the endometrial canal. Right ovary Measurements: 3.4 x 1.9 x 2.3 cm. Normal appearance/no adnexal mass. Left ovary Measurements: 3.6 x 1.4 x 2.7 cm. Normal appearance/no adnexal mass. Other findings Small amount of free pelvic fluid. IMPRESSION: 1. Normal appearance of the uterus and ovaries. 2. If bleeding remains unresponsive to  hormonal or medical therapy, sonohysterogram should be considered for focal lesion work-up. (Ref: Radiological Reasoning: Algorithmic Workup of Abnormal Vaginal Bleeding with Endovaginal Sonography and Sonohysterography. AJR [id number]) Electronically Signed   By: Nolon Nations M.D.   On: 04/20/2015 11:39      Assessment & Plan:  1. PCOS (polycystic ovarian syndrome) 2. Abnormal uterine bleeding (AUB) Reviewed lab and imaging results with patient.  Discussed diagnosis of PCOS.  Counseled patient about management of PCOS, recommended weight loss which helps with restoring ovulatory cycles, decreases glucose intolerance with improvement of metabolic risk, improves fertility/pregnancy rates and helps with overall health.  Even modest weight loss (5 to 10 percent reduction in body weight) in women with PCOS may result in these effects.  OCPs are also the mainstay of pharmacologic therapy for women with PCOS for managing hyperandrogenism and menstrual dysfunction and for providing contraception.   PCOS is also treated with Metformin given its association with glucose intolerance and insulin resistance.  Over 50% of PCOS patients on at least 1500 mg of Metformin daily have been shown to ovulate successfully. Patient wants to try weight loss for now, will consider OCPs later.  Bleeding precautions reviewed, has Megace as needed. Follow up in about 3 months for reevaluation.  3. Unsatisfactory cytology of cervical Papanicolaou  smear Repeat pap smear done today, will follow up results and manage accordingly. - Cytology - PAP   Total face-to-face time with patient: 15 minutes. Over 50% of encounter was spent on counseling and coordination of care.   Verita Schneiders, MD, [place] Attending Obstetrician & Gynecologist, [place] [facility]

## 2015-05-03 LAB — CYTOLOGY - PAP

## 2015-05-05 ENCOUNTER — Ambulatory Visit (INDEPENDENT_AMBULATORY_CARE_PROVIDER_SITE_OTHER): Payer: BLUE CROSS/BLUE SHIELD | Admitting: Psychology

## 2015-05-05 DIAGNOSIS — F331 Major depressive disorder, recurrent, moderate: Secondary | ICD-10-CM

## 2015-05-29 ENCOUNTER — Ambulatory Visit (INDEPENDENT_AMBULATORY_CARE_PROVIDER_SITE_OTHER): Payer: BLUE CROSS/BLUE SHIELD | Admitting: Psychology

## 2015-05-29 DIAGNOSIS — F331 Major depressive disorder, recurrent, moderate: Secondary | ICD-10-CM

## 2015-06-16 ENCOUNTER — Ambulatory Visit (INDEPENDENT_AMBULATORY_CARE_PROVIDER_SITE_OTHER): Payer: BLUE CROSS/BLUE SHIELD | Admitting: Psychology

## 2015-06-16 DIAGNOSIS — F4321 Adjustment disorder with depressed mood: Secondary | ICD-10-CM

## 2015-07-14 ENCOUNTER — Ambulatory Visit (INDEPENDENT_AMBULATORY_CARE_PROVIDER_SITE_OTHER): Payer: BLUE CROSS/BLUE SHIELD | Admitting: Psychology

## 2015-07-14 DIAGNOSIS — F4321 Adjustment disorder with depressed mood: Secondary | ICD-10-CM

## 2015-08-11 ENCOUNTER — Ambulatory Visit (INDEPENDENT_AMBULATORY_CARE_PROVIDER_SITE_OTHER): Payer: BLUE CROSS/BLUE SHIELD | Admitting: Psychology

## 2015-08-11 DIAGNOSIS — F4321 Adjustment disorder with depressed mood: Secondary | ICD-10-CM

## 2015-09-15 ENCOUNTER — Ambulatory Visit: Payer: Self-pay | Admitting: Psychology

## 2015-10-06 ENCOUNTER — Ambulatory Visit (INDEPENDENT_AMBULATORY_CARE_PROVIDER_SITE_OTHER): Payer: BLUE CROSS/BLUE SHIELD | Admitting: Psychology

## 2015-10-06 DIAGNOSIS — F4321 Adjustment disorder with depressed mood: Secondary | ICD-10-CM | POA: Diagnosis not present

## 2015-10-20 ENCOUNTER — Ambulatory Visit (INDEPENDENT_AMBULATORY_CARE_PROVIDER_SITE_OTHER): Payer: BLUE CROSS/BLUE SHIELD | Admitting: Psychology

## 2015-10-20 DIAGNOSIS — F4321 Adjustment disorder with depressed mood: Secondary | ICD-10-CM | POA: Diagnosis not present

## 2015-11-17 ENCOUNTER — Ambulatory Visit (INDEPENDENT_AMBULATORY_CARE_PROVIDER_SITE_OTHER): Payer: BLUE CROSS/BLUE SHIELD | Admitting: Psychology

## 2015-11-17 DIAGNOSIS — F4321 Adjustment disorder with depressed mood: Secondary | ICD-10-CM | POA: Diagnosis not present

## 2016-06-14 IMAGING — US US PELVIS COMPLETE
1 series · 13 of 25 positions shown · non-contrast
Comparison: None

CLINICAL DATA: Heavy vaginal bleeding 3 weeks.  Unknown LMP.

EXAM:
TRANSABDOMINAL AND TRANSVAGINAL ULTRASOUND OF PELVIS
TECHNIQUE: Both transabdominal and transvaginal ultrasound examinations of the
pelvis were performed. Transabdominal technique was performed for
global imaging of the pelvis including uterus, ovaries, adnexal
regions, and pelvic cul-de-sac. It was necessary to proceed with
endovaginal exam following the transabdominal exam to visualize the
uterus and endometrium, ovaries.

[Series 1: us pelvis complete · 0.22mm/px · 13 of 57 slices shown]
[im 1/57]
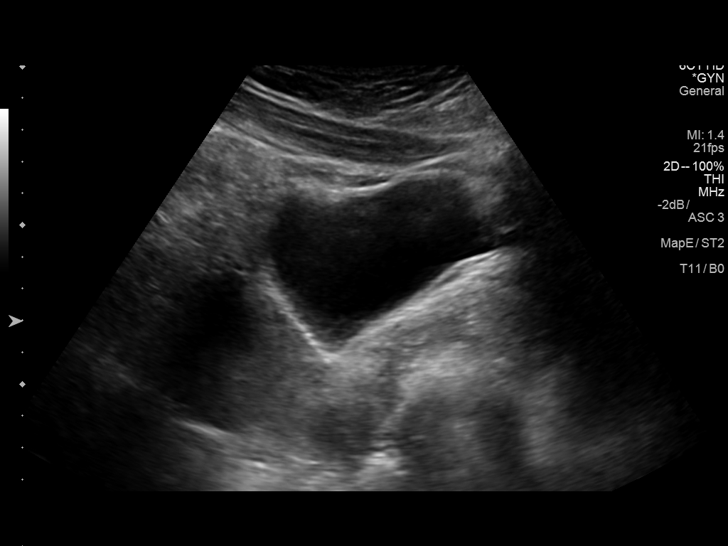
[im 5/57]
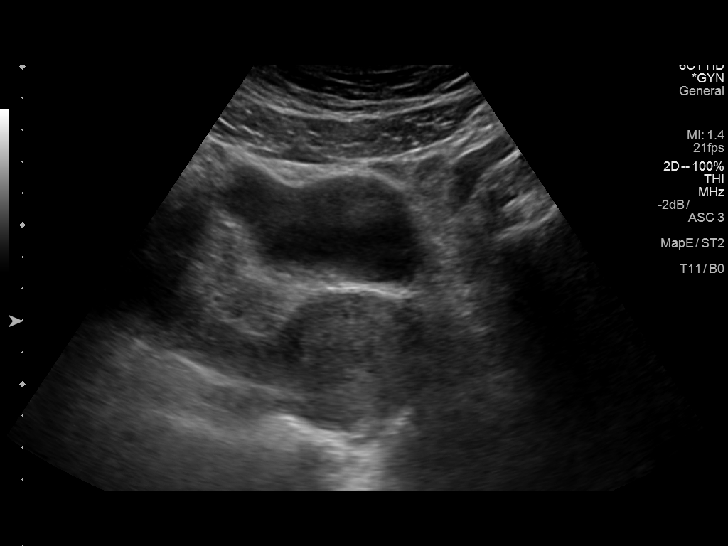
[im 10/57]
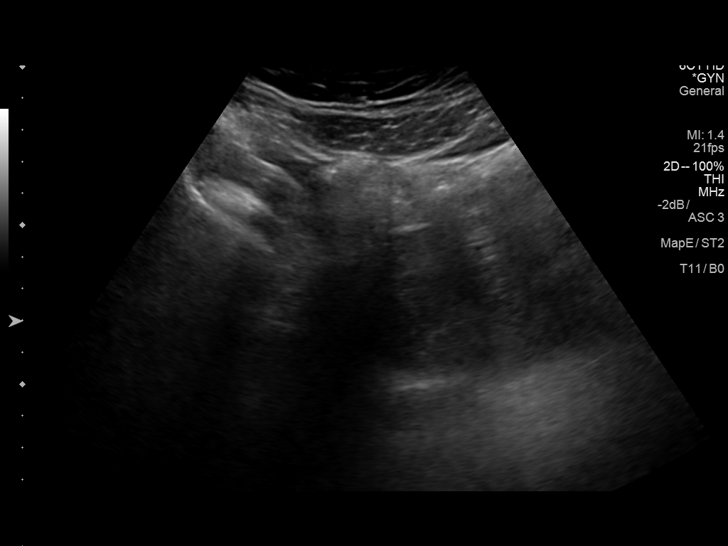
[im 15/57]
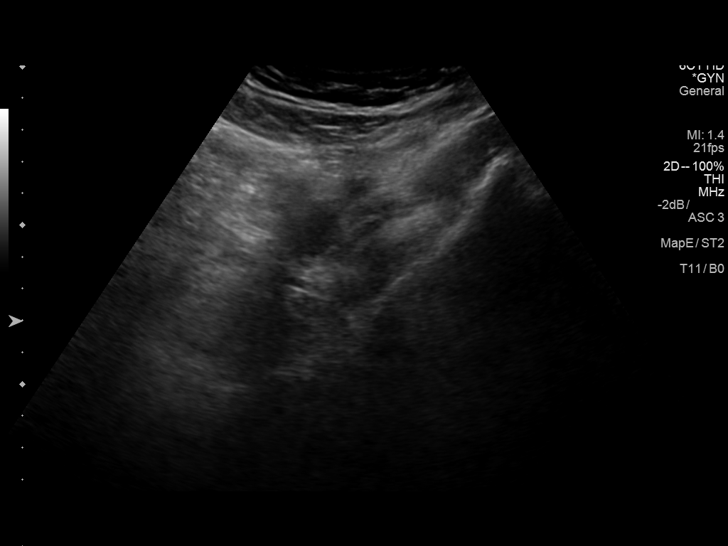
[im 19/57]
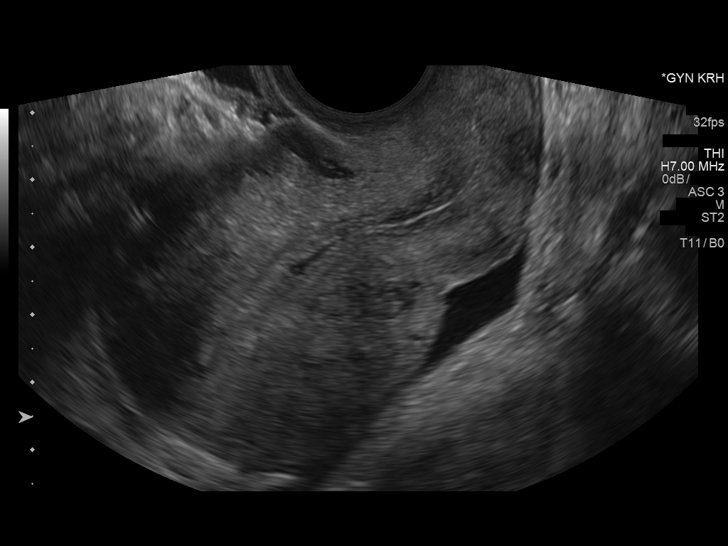
[im 24/57]
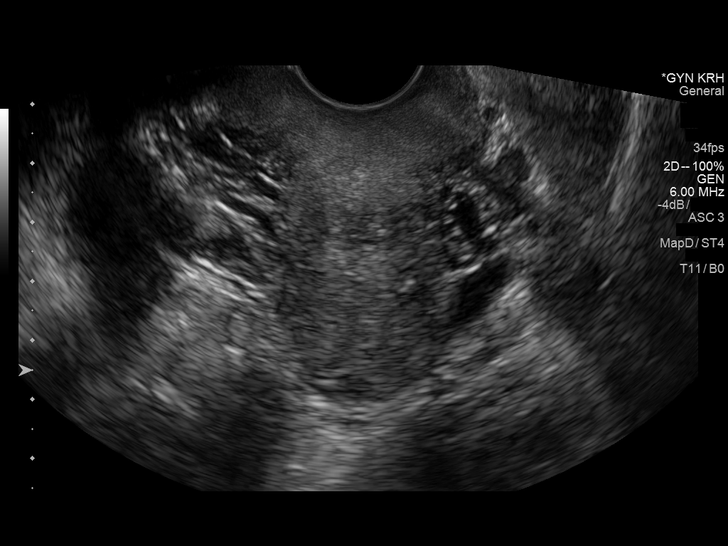
[im 29/57]
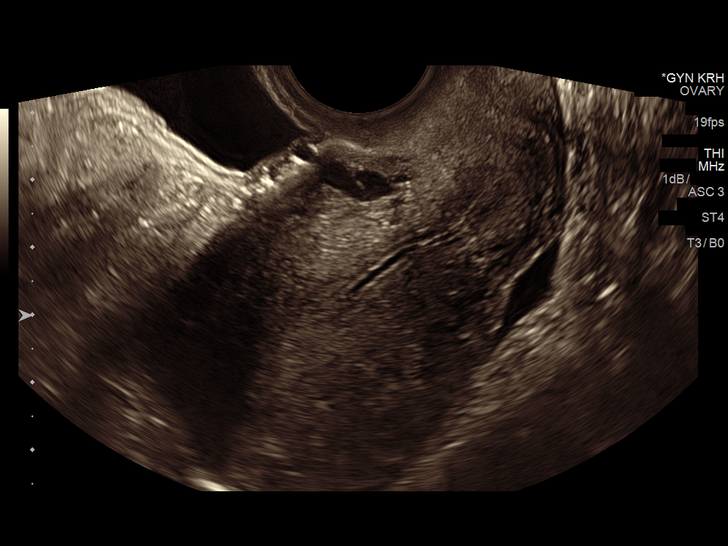
[im 33/57]
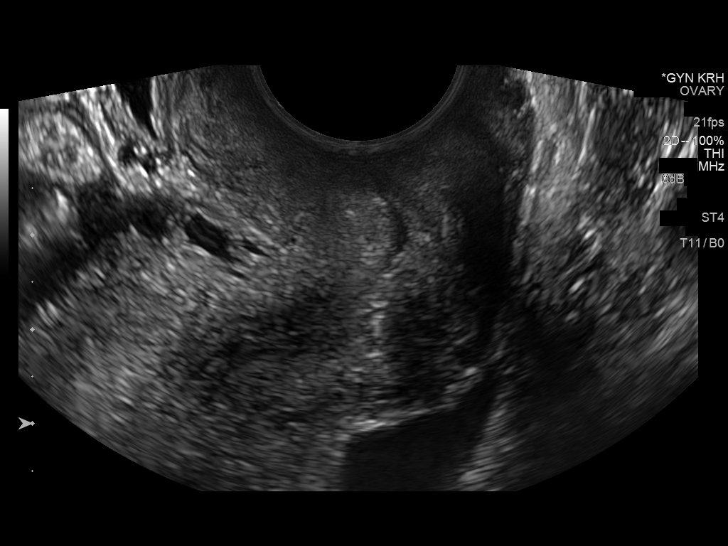
[im 38/57]
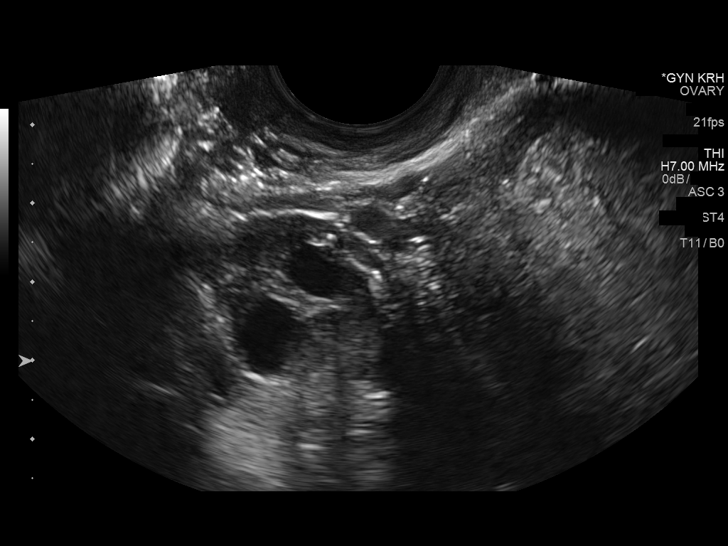
[im 43/57]
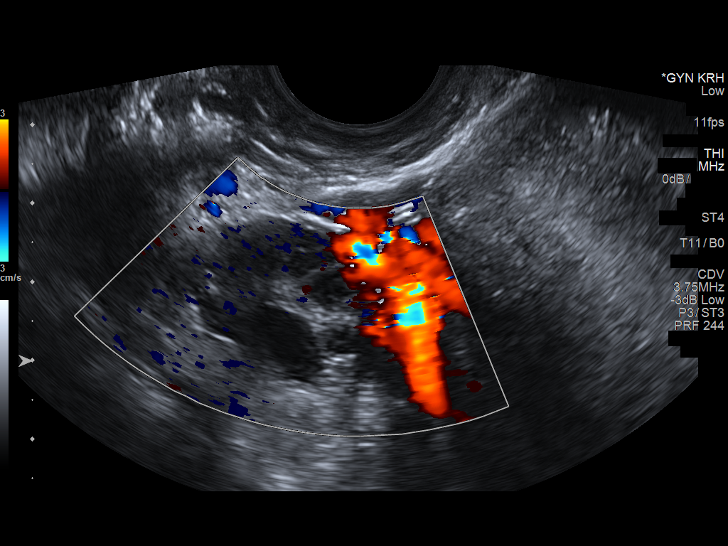
[im 47/57]
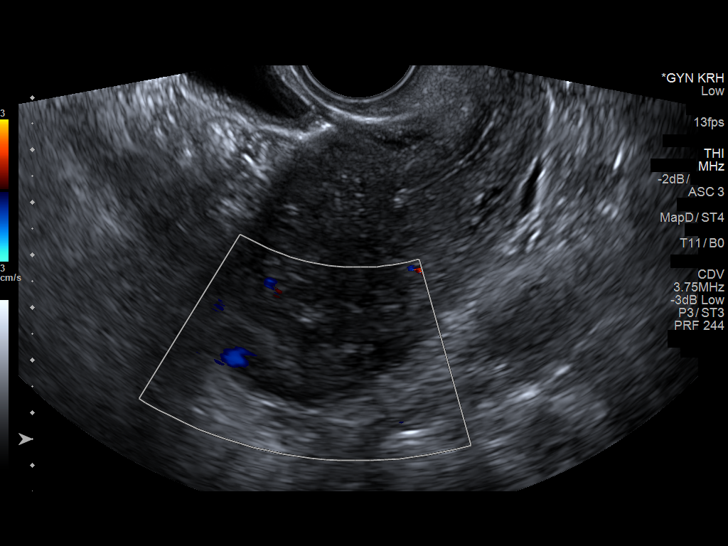
[im 52/57]
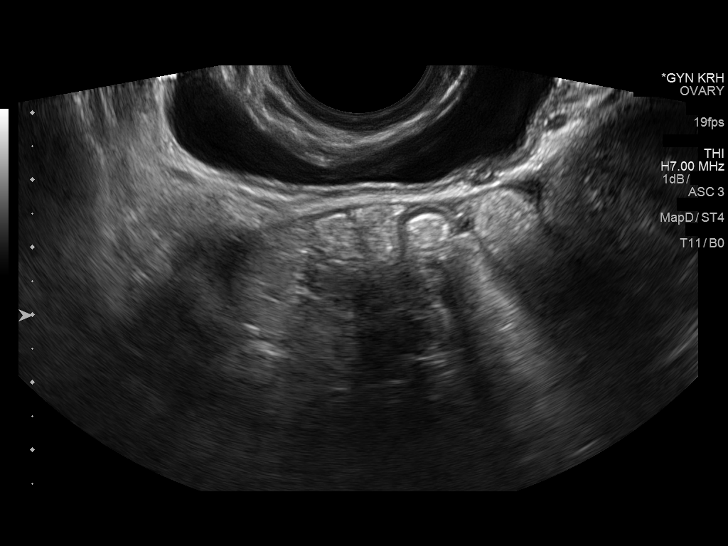
[im 57/57]
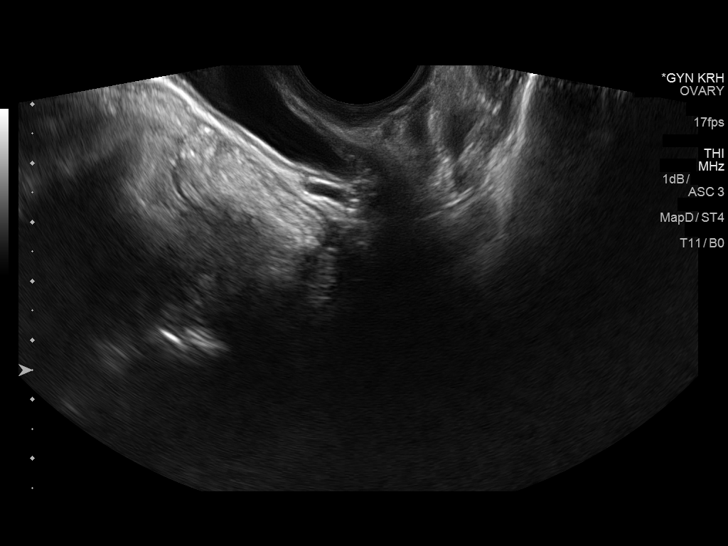

[13 of 25 positions shown; findings below may reference images not displayed]

FINDINGS: Uterus

Measurements: 7.5 x 4.4 x 5.2 cm. No fibroids or other mass
visualized.

Endometrium

Thickness: 9.0 mm.  Wrist trace fluid within the endometrial canal.

Right ovary

Measurements: 3.4 x 1.9 x 2.3 cm. Normal appearance/no adnexal mass.

Left ovary

Measurements: 3.6 x 1.4 x 2.7 cm. Normal appearance/no adnexal mass.

Other findings

Small amount of free pelvic fluid.
IMPRESSION: 1. Normal appearance of the uterus and ovaries.
2. If bleeding remains unresponsive to hormonal or medical therapy,
sonohysterogram should be considered for focal lesion work-up. (Ref:
Radiological Reasoning: Algorithmic Workup of Abnormal Vaginal
Bleeding with Endovaginal Sonography and Sonohysterography. AJR
2002; [id number])
